# Patient Record
Sex: Male | Born: 1985 | Race: Black or African American | Hispanic: No | Marital: Single | Smoking: Current every day smoker
Health system: Southern US, Community
[De-identification: ages and names within clinical notes are randomized; demographics above are authoritative.]

---

## 2013-12-25 ENCOUNTER — Emergency Department (HOSPITAL_COMMUNITY)
Admission: EM | Admit: 2013-12-25 | Discharge: 2013-12-25 | Disposition: A | Discharge status: Home / Self Care | Payer: Self-pay | Attending: Emergency Medicine | Admitting: Emergency Medicine

## 2013-12-25 ENCOUNTER — Emergency Department (HOSPITAL_COMMUNITY): Payer: Self-pay

## 2013-12-25 ENCOUNTER — Encounter (HOSPITAL_COMMUNITY): Payer: Self-pay | Admitting: Emergency Medicine

## 2013-12-25 DIAGNOSIS — S99919A Unspecified injury of unspecified ankle, initial encounter: Principal | ICD-10-CM

## 2013-12-25 DIAGNOSIS — S8990XA Unspecified injury of unspecified lower leg, initial encounter: Secondary | ICD-10-CM | POA: Insufficient documentation

## 2013-12-25 DIAGNOSIS — Y929 Unspecified place or not applicable: Secondary | ICD-10-CM | POA: Insufficient documentation

## 2013-12-25 DIAGNOSIS — X500XXA Overexertion from strenuous movement or load, initial encounter: Secondary | ICD-10-CM | POA: Insufficient documentation

## 2013-12-25 DIAGNOSIS — F17200 Nicotine dependence, unspecified, uncomplicated: Secondary | ICD-10-CM | POA: Insufficient documentation

## 2013-12-25 DIAGNOSIS — Y9389 Activity, other specified: Secondary | ICD-10-CM | POA: Insufficient documentation

## 2013-12-25 DIAGNOSIS — S99929A Unspecified injury of unspecified foot, initial encounter: Principal | ICD-10-CM

## 2013-12-25 DIAGNOSIS — M25561 Pain in right knee: Secondary | ICD-10-CM

## 2013-12-25 MED ORDER — TRAMADOL HCL 50 MG PO TABS: 50.0000 mg | ORAL_TABLET | Freq: Four times a day (QID) | ORAL | Status: DC | PRN

## 2013-12-25 MED ORDER — NAPROXEN 500 MG PO TABS: 500.0000 mg | ORAL_TABLET | Freq: Two times a day (BID) | ORAL | Status: DC

## 2013-12-25 NOTE — ED Provider Notes (Signed)
CSN: [id number]     Arrival date & time 12/25/13  1013 History  This chart was scribed for non-physician practitioner Pauline Ausammy Andersson Larrabee, PA-C working with Shon Batonourtney F Horton, MD by Leone PayorSonum Patel, ED Scribe. This patient was seen in room APFT24/APFT24 and the patient's care was started at 11:20 AM.    Chief Complaint  Patient presents with  . Knee Pain    right    The history is provided by the patient. No language interpreter was used.    HPI Comments: Mark Ochoa is a 28 y.o. male who presents to the Emergency Department complaining of 1 day of sudden onset, constant, gradually worsening right knee pain with associated swelling. Patient states he was bending the right knee to visualize the bottom of his right foot when he heard and felt a pop. He states the pain is worse with movement and walking. He has [facility] powders with minimal relief. He denies recent fall or trauma to the affected area, redness, excessive warmth, numbness or weakness of the leg. He denies prior surgery to the right knee. He denies any other pain at this time.   History reviewed. No pertinent past medical history. History reviewed. No pertinent past surgical history. No family history on file. History  Substance Use Topics  . Smoking status: Current Every Day Smoker -- 0.50 packs/day    Types: Cigarettes  . Smokeless tobacco: Not on file  . Alcohol Use: Yes    Review of Systems  Constitutional: Negative for fever and chills.  Genitourinary: Negative for dysuria and difficulty urinating.  Musculoskeletal: Positive for arthralgias and joint swelling.  Skin: Negative for color change and wound.  Neurological: Negative for weakness and numbness.  All other systems reviewed and are negative.     Allergies  Review of patient's allergies indicates no known allergies.  Home Medications   Prior to Admission medications   Not on File   BP 121/74  Pulse 51  Temp(Src) 98.4 F (36.9 C) (Oral)  Resp 16  Ht 6'  7" (2.007 m)  Wt 200 lb (90.719 kg)  BMI 22.52 kg/m2  SpO2 100% Physical Exam  Nursing note and vitals reviewed. Constitutional: He is oriented to person, place, and time. He appears well-developed and well-nourished.  HENT:  Head: Normocephalic and atraumatic.  Cardiovascular: Normal rate, regular rhythm, normal heart sounds and intact distal pulses.   No murmur heard. Pulmonary/Chest: Effort normal and breath sounds normal. No respiratory distress. He has no wheezes. He has no rales.  Abdominal: He exhibits no distension.  Musculoskeletal: Normal range of motion. He exhibits tenderness. He exhibits no edema.  Localized tenderness to palpation to the lateral right knee. No edema, erythema, or effusion. Distal sensation intact, DP pulse brisk.  Pt has full ROM of the knee.  Compartments of the right leg are soft  Neurological: He is alert and oriented to person, place, and time. He exhibits normal muscle tone. Coordination normal.  Skin: Skin is warm and dry.  Psychiatric: He has a normal mood and affect.    ED Course  Procedures (including critical care time)  DIAGNOSTIC STUDIES: Oxygen Saturation is 100% on RA, normal by my interpretation.    COORDINATION OF CARE: 11:24 AM Will XRAY the right knee. Discussed treatment plan with pt at bedside and pt agreed to plan.   Labs Review Labs Reviewed - No data to display  Imaging Review Dg Knee Complete 4 Views Right  12/25/2013   CLINICAL DATA:  Pain  EXAM: RIGHT KNEE - COMPLETE 4+ VIEW  COMPARISON:  None.  FINDINGS: Frontal, lateral, and bilateral oblique views were obtained. There is no fracture, dislocation, or effusion. Joint spaces appear intact. No erosive change.  IMPRESSION: No abnormality noted.   Electronically Signed   By: Bretta Bang M.D.   On: 12/25/2013 12:24     EKG Interpretation None      MDM   Final diagnoses:  Right knee pain    Pt is ambulatory.  No concerning sx's for septic joint.  No edema or  effusion of the joint.  Likely sprain.  ACE wrap applied for support, he agrees to elevate, ice and orthopedic f/u.  Precautions given for excessive use of the ace wrap.    I personally performed the services described in this documentation, which was scribed in my presence. The recorded information has been reviewed and is accurate.   Diala Waxman L. Trisha Mangle, PA-C 12/25/13 1659

## 2013-12-25 NOTE — ED Notes (Signed)
Pt states he bent his right leg to look at the bottom of his foot and felt a pop in his knee and has been having worsening pain since.

## 2013-12-25 NOTE — ED Notes (Signed)
PA at bedside.

## 2013-12-25 NOTE — ED Provider Notes (Signed)
Medical screening examination/treatment/procedure(s) were performed by non-physician practitioner and as supervising physician I was immediately available for consultation/collaboration.   EKG Interpretation None        Courtney F Horton, MD 12/25/13 1847 

## 2013-12-25 NOTE — Discharge Instructions (Signed)

## 2013-12-25 NOTE — Care Management Note (Signed)
ED/CM noted patient did not have health insurance and/or PCP listed in the computer.  Patient was given the [place] resource handout with information on the clinics, food pantries, and the handout for new health insurance sign-up.  Patient expressed appreciation for information received. 

## 2018-06-22 ENCOUNTER — Emergency Department
Admission: EM | Admit: 2018-06-22 | Discharge: 2018-06-22 | Disposition: A | Discharge status: Home / Self Care | Payer: Self-pay | Attending: Student in an Organized Health Care Education/Training Program | Admitting: Student in an Organized Health Care Education/Training Program

## 2018-06-22 ENCOUNTER — Other Ambulatory Visit: Payer: Self-pay

## 2018-06-22 ENCOUNTER — Encounter: Payer: Self-pay | Admitting: Emergency Medicine

## 2018-06-22 DIAGNOSIS — K529 Noninfective gastroenteritis and colitis, unspecified: Secondary | ICD-10-CM | POA: Insufficient documentation

## 2018-06-22 DIAGNOSIS — F17210 Nicotine dependence, cigarettes, uncomplicated: Secondary | ICD-10-CM | POA: Insufficient documentation

## 2018-06-22 LAB — CBC
HCT: 42.4 % (ref 39.0–52.0)
HEMOGLOBIN: 14 g/dL (ref 13.0–17.0)
MCH: 29.1 pg (ref 26.0–34.0)
MCHC: 33 g/dL (ref 30.0–36.0)
MCV: 88.1 fL (ref 80.0–100.0)
Platelets: 294 10*3/uL (ref 150–400)
RBC: 4.81 MIL/uL (ref 4.22–5.81)
RDW: 13.1 % (ref 11.5–15.5)
WBC: 6.3 10*3/uL (ref 4.0–10.5)
nRBC: 0 % (ref 0.0–0.2)

## 2018-06-22 LAB — COMPREHENSIVE METABOLIC PANEL
ALT: 18 U/L (ref 0–44)
AST: 29 U/L (ref 15–41)
Albumin: 4.1 g/dL (ref 3.5–5.0)
Alkaline Phosphatase: 74 U/L (ref 38–126)
Anion gap: 6 (ref 5–15)
BILIRUBIN TOTAL: 1.9 mg/dL — AB (ref 0.3–1.2)
BUN: 10 mg/dL (ref 6–20)
CHLORIDE: 107 mmol/L (ref 98–111)
CO2: 25 mmol/L (ref 22–32)
Calcium: 9.3 mg/dL (ref 8.9–10.3)
Creatinine, Ser: 0.84 mg/dL (ref 0.61–1.24)
GFR calc Af Amer: >60 mL/min (ref >60)
GFR calc non Af Amer: >60 mL/min (ref >60)
Glucose, Bld: 98 mg/dL (ref 70–99)
POTASSIUM: 4.1 mmol/L (ref 3.5–5.1)
Sodium: 138 mmol/L (ref 135–145)
Total Protein: 7.1 g/dL (ref 6.5–8.1)

## 2018-06-22 LAB — LIPASE, BLOOD: Lipase: 31 U/L (ref 11–51)

## 2018-06-22 MED ORDER — OSELTAMIVIR PHOSPHATE 75 MG PO CAPS: 75.0000 mg | ORAL_CAPSULE | Freq: Two times a day (BID) | ORAL | 0 refills | Status: DC

## 2018-06-22 MED ORDER — ONDANSETRON 4 MG PO TBDP
4.0000 mg | ORAL_TABLET | Freq: Once | ORAL | Status: AC | PRN
  Administered 2018-06-22: 4 mg via ORAL
  Filled 2018-06-22: qty 1

## 2018-06-22 MED ORDER — ONDANSETRON 4 MG PO TBDP: 4.0000 mg | ORAL_TABLET | Freq: Three times a day (TID) | ORAL | 0 refills | Status: DC | PRN

## 2018-06-22 NOTE — ED Notes (Signed)
See triage note  Presents with some diarrhea and nausea  States sx's started yesterday  No fever

## 2018-06-22 NOTE — ED Triage Notes (Signed)
Pt arrived with complaints of n/v/d that started yesterday. Pt reports 1 episode of emesis and 3 episodes of diarrhea. Pt also reports abdominal cramping.

## 2018-06-22 NOTE — ED Provider Notes (Signed)
[facility] Emergency Department Provider Note  ____________________________________________   First MD Initiated Contact with Patient 06/22/18 1455     (approximate)  I have reviewed the triage vital signs and the nursing notes.   HISTORY  Chief Complaint Diarrhea and Emesis    HPI Mark Ochoa is a 33 y.o. male presents emergency department with diarrhea and nausea with some vomiting.  Symptoms started yesterday.  No known fever.  States he did feel hot and cold.  No abdominal pain.  He had one episode of vomiting and 3 episodes of diarrhea.  He states his son has had the same symptoms.  His daughter had symptoms like this last week.    History reviewed. No pertinent past medical history.  There are no active problems to display for this patient.   History reviewed. No pertinent surgical history.  Prior to Admission medications   Medication Sig Start Date End Date Taking? Authorizing Provider  ondansetron (ZOFRAN-ODT) 4 MG disintegrating tablet Take 1 tablet (4 mg total) by mouth every 8 (eight) hours as needed. 06/22/18   Aran Menning, Roselyn Bering, PA-C  oseltamivir (TAMIFLU) 75 MG capsule Take 1 capsule (75 mg total) by mouth 2 (two) times daily. 06/22/18   Faythe Ghee, PA-C    Allergies Patient has no known allergies.  No family history on file.  Social History Social History   Tobacco Use  . Smoking status: Current Every Day Smoker    Packs/day: 0.50    Types: Cigarettes  Substance Use Topics  . Alcohol use: Yes  . Drug use: Not on file    Review of Systems  Constitutional: No fever/chills Eyes: No visual changes. ENT: No sore throat. Respiratory: Denies cough Gastrointestinal: Positive for nausea/vomiting/diarrhea Genitourinary: Negative for dysuria. Musculoskeletal: Negative for back pain. Skin: Negative for rash.    ____________________________________________   PHYSICAL EXAM:  VITAL SIGNS: ED Triage Vitals  Enc  Vitals Group     BP 06/22/18 1314 117/73     Pulse Rate 06/22/18 1314 66     Resp 06/22/18 1314 18     Temp 06/22/18 1314 98.6 F (37 C)     Temp Source 06/22/18 1314 Oral     SpO2 06/22/18 1314 100 %     Weight 06/22/18 1315 200 lb (90.7 kg)     Height 06/22/18 1315 6\' 8"  (2.032 m)     Head Circumference --      Peak Flow --      Pain Score 06/22/18 1315 6     Pain Loc --      Pain Edu? --      Excl. in GC? --     Constitutional: Alert and oriented. Well appearing and in no acute distress. Eyes: Conjunctivae are normal.  Head: Atraumatic. Nose: No congestion/rhinnorhea. Mouth/Throat: Mucous membranes are moist.  Throat appears normal Neck:  supple no lymphadenopathy noted Cardiovascular: Normal rate, regular rhythm. Heart sounds are normal Respiratory: Normal respiratory effort.  No retractions, lungs c t a  Abd: soft nontender bs normal all 4 quad GU: deferred Musculoskeletal: FROM all extremities, warm and well perfused Neurologic:  Normal speech and language.  Skin:  Skin is warm, dry and intact. No rash noted. Psychiatric: Mood and affect are normal. Speech and behavior are normal.  ____________________________________________   LABS (all labs ordered are listed, but only abnormal results are displayed)  Labs Reviewed  COMPREHENSIVE METABOLIC PANEL - Abnormal; Notable for the following components:  Result Value   Total Bilirubin 1.9 (*)    All other components within normal limits  LIPASE, BLOOD  CBC  URINALYSIS, COMPLETE (UACMP) WITH MICROSCOPIC   ____________________________________________   ____________________________________________  RADIOLOGY    ____________________________________________   PROCEDURES  Procedure(s) performed: Zofran 4 mg ODT  Procedures    ____________________________________________   INITIAL IMPRESSION / ASSESSMENT AND PLAN / ED COURSE  Pertinent labs & imaging results that were available during my care of the  patient were reviewed by me and considered in my medical decision making (see chart for details).   Patient is 33 year old male presents emergency department complaining of nausea/vomiting/diarrhea.  Symptoms started yesterday.  Other family members have the same.  Physical exam shows that patient appears well.  Abdomen is nontender.  Remainder exam is unremarkable  CBC, complete metabolic panel, and lipase are all normal   Patient states that he is not had any more vomiting since the Zofran.  I explained all the test results to him.  Told him this is a viral illness.  His son's test is positive for influenza B so he was placed on Tamiflu also.  Is given a prescription for Zofran.  He was given a work note.  He is not to return to work until he has been fever free and free of diarrhea for 24 hours.  He states he understands will comply with our instructions.  He was discharged in stable condition.  As part of my medical decision making, I reviewed the following data within the electronic MEDICAL RECORD NUMBER Nursing notes reviewed and incorporated, Labs reviewed CBC, metabolic panel, and lipase are normal, Old chart reviewed, Notes from prior ED visits and  Controlled Substance Database  ____________________________________________   FINAL CLINICAL IMPRESSION(S) / ED DIAGNOSES  Final diagnoses:  Gastroenteritis      NEW MEDICATIONS STARTED DURING THIS VISIT:  New Prescriptions   ONDANSETRON (ZOFRAN-ODT) 4 MG DISINTEGRATING TABLET    Take 1 tablet (4 mg total) by mouth every 8 (eight) hours as needed.   OSELTAMIVIR (TAMIFLU) 75 MG CAPSULE    Take 1 capsule (75 mg total) by mouth 2 (two) times daily.     Note:  This document was prepared using Dragon voice recognition software and may include unintentional dictation errors.    Faythe Ghee, PA-C 06/22/18 1623    Willy Eddy, MD 06/22/18 [phone]

## 2018-06-22 NOTE — Discharge Instructions (Addendum)
Follow-up with your regular doctor if not better in 3 days.  Return emergency department worsening.  Drink plenty of fluids.  Tylenol and ibuprofen for fever.  Zofran for nausea/vomiting.  Over-the-counter Imodium for diarrhea.

## 2018-09-06 ENCOUNTER — Other Ambulatory Visit: Payer: Self-pay

## 2018-09-06 ENCOUNTER — Encounter: Payer: Self-pay | Admitting: Emergency Medicine

## 2018-09-06 ENCOUNTER — Emergency Department
Admission: EM | Admit: 2018-09-06 | Discharge: 2018-09-06 | Disposition: A | Discharge status: Home / Self Care | Payer: Self-pay | Attending: Emergency Medicine | Admitting: Emergency Medicine

## 2018-09-06 DIAGNOSIS — J020 Streptococcal pharyngitis: Secondary | ICD-10-CM | POA: Insufficient documentation

## 2018-09-06 DIAGNOSIS — F17210 Nicotine dependence, cigarettes, uncomplicated: Secondary | ICD-10-CM | POA: Insufficient documentation

## 2018-09-06 LAB — GROUP A STREP BY PCR: Group A Strep by PCR: DETECTED — AB

## 2018-09-06 MED ORDER — LIDOCAINE VISCOUS HCL 2 % MT SOLN: 10.0000 mL | OROMUCOSAL | 0 refills | Status: DC | PRN

## 2018-09-06 MED ORDER — AMOXICILLIN 500 MG PO CAPS
500.0000 mg | ORAL_CAPSULE | Freq: Once | ORAL | Status: AC
  Administered 2018-09-06: 500 mg via ORAL
  Filled 2018-09-06: qty 1

## 2018-09-06 MED ORDER — LIDOCAINE VISCOUS HCL 2 % MT SOLN
15.0000 mL | Freq: Once | OROMUCOSAL | Status: AC
  Administered 2018-09-06: 15 mL via OROMUCOSAL
  Filled 2018-09-06: qty 15

## 2018-09-06 MED ORDER — AMOXICILLIN 500 MG PO CAPS: 500.0000 mg | ORAL_CAPSULE | Freq: Three times a day (TID) | ORAL | 0 refills | Status: DC

## 2018-09-06 NOTE — ED Triage Notes (Signed)
Pt here with c/o sore throat, states he has a hx of strep and this feels the same. Denies fever, states no white spots on back of throat yesterday, not sure today. NAD, denies cough.

## 2018-09-06 NOTE — ED Provider Notes (Signed)
[facility] Emergency Department Provider Note  ____________________________________________  Time seen: Approximately 2:13 PM  I have reviewed the triage vital signs and the nursing notes.   HISTORY  Chief Complaint Sore Throat    HPI Mark Ochoa is a 33 y.o. male that presents to the emergency department for evaluation of sore throat.  Patient has had strep throat in the past and states that this feels the same.  No sick contacts.  No fever.   History reviewed. No pertinent past medical history.  There are no active problems to display for this patient.   History reviewed. No pertinent surgical history.  Prior to Admission medications   Medication Sig Start Date End Date Taking? Authorizing Provider  amoxicillin (AMOXIL) 500 MG capsule Take 1 capsule (500 mg total) by mouth 3 (three) times daily. 09/06/18   Enid Derry, PA-C  lidocaine (XYLOCAINE) 2 % solution Use as directed 10 mLs in the mouth or throat as needed. 09/06/18   Enid Derry, PA-C    Allergies Patient has no known allergies.  No family history on file.  Social History Social History   Tobacco Use  . Smoking status: Current Every Day Smoker    Packs/day: 0.50    Types: Cigarettes  . Smokeless tobacco: Never Used  Substance Use Topics  . Alcohol use: Yes  . Drug use: Not on file     Review of Systems  Constitutional: No fever/chills Eyes: No visual changes. No discharge. ENT: Negative for congestion and rhinorrhea.  Positive for sore throat. Cardiovascular: No chest pain. Respiratory: Negative for cough. No SOB. Gastrointestinal: No abdominal pain.  No nausea, no vomiting.  Musculoskeletal: Negative for musculoskeletal pain. Skin: Negative for rash, abrasions, lacerations, ecchymosis. Neurological: Negative for headaches.   ____________________________________________   PHYSICAL EXAM:  VITAL SIGNS: ED Triage Vitals  Enc Vitals Group     BP 09/06/18 1219  118/69     Pulse Rate 09/06/18 1219 64     Resp 09/06/18 1219 18     Temp 09/06/18 1219 98.9 F (37.2 C)     Temp Source 09/06/18 1219 Oral     SpO2 09/06/18 1219 100 %     Weight 09/06/18 1219 200 lb (90.7 kg)     Height 09/06/18 1219 6\' 8"  (2.032 m)     Head Circumference --      Peak Flow --      Pain Score 09/06/18 1224 10     Pain Loc --      Pain Edu? --      Excl. in GC? --      Constitutional: Alert and oriented. Well appearing and in no acute distress. Eyes: Conjunctivae are normal. PERRL. EOMI. No discharge. Head: Atraumatic. ENT: No frontal and maxillary sinus tenderness.      Ears: Tympanic membranes pearly gray with good landmarks. No discharge.      Nose: No congestion/rhinnorhea.      Mouth/Throat: Mucous membranes are moist. Oropharynx erythematous. Tonsils not enlarged.  Minimal exudates bilaterally. Uvula midline. Neck: No stridor.   Hematological/Lymphatic/Immunilogical: No cervical lymphadenopathy. Cardiovascular: Normal rate, regular rhythm.  Good peripheral circulation. Respiratory: Normal respiratory effort without tachypnea or retractions. Lungs CTAB. Good air entry to the bases with no decreased or absent breath sounds. Gastrointestinal: Bowel sounds 4 quadrants. Soft and nontender to palpation. No guarding or rigidity. No palpable masses. No distention. Musculoskeletal: Full range of motion to all extremities. No gross deformities appreciated. Neurologic:  Normal speech and language.  No gross focal neurologic deficits are appreciated.  Skin:  Skin is warm, dry and intact. No rash noted. Psychiatric: Mood and affect are normal. Speech and behavior are normal. Patient exhibits appropriate insight and judgement.   ____________________________________________   LABS (all labs ordered are listed, but only abnormal results are displayed)  Labs Reviewed  GROUP A STREP BY PCR - Abnormal; Notable for the following components:      Result Value   Group A  Strep by PCR DETECTED (*)    All other components within normal limits   ____________________________________________  EKG   ____________________________________________  RADIOLOGY   No results found.  ____________________________________________    PROCEDURES  Procedure(s) performed:    Procedures    Medications  amoxicillin (AMOXIL) capsule 500 mg (500 mg Oral Given 09/06/18 1425)  lidocaine (XYLOCAINE) 2 % viscous mouth solution 15 mL (15 mLs Mouth/Throat Given 09/06/18 1426)     ____________________________________________   INITIAL IMPRESSION / ASSESSMENT AND PLAN / ED COURSE  Pertinent labs & imaging results that were available during my care of the patient were reviewed by me and considered in my medical decision making (see chart for details).  Review of the [place] CSRS was performed in accordance of the [facility] prior to dispensing any controlled drugs.   Patient's diagnosis is consistent with strep pharyngitis. Vital signs and exam are reassuring.  Strep test is positive.  Patient appears well and is staying well hydrated. Patient should alternate tylenol and ibuprofen for fever. Patient feels comfortable going home. Patient will be discharged home with prescriptions for amoxicillin and viscous lidocaine. Patient is to follow up with primary care as needed or otherwise directed. Patient is given ED precautions to return to the ED for any worsening or new symptoms.     ____________________________________________  FINAL CLINICAL IMPRESSION(S) / ED DIAGNOSES  Final diagnoses:  Strep throat      NEW MEDICATIONS STARTED DURING THIS VISIT:  ED Discharge Orders         Ordered    amoxicillin (AMOXIL) 500 MG capsule  3 times daily     09/06/18 1434    lidocaine (XYLOCAINE) 2 % solution  As needed     09/06/18 1434              This chart was dictated using voice recognition software/Dragon. Despite best efforts to proofread, errors can occur which  can change the meaning. Any change was purely unintentional.    Enid DerryWagner, Lilliane Sposito, PA-C 09/06/18 1554    Emily FilbertWilliams, Jonathan E, MD 09/06/18 [phone]

## 2019-06-28 ENCOUNTER — Other Ambulatory Visit: Payer: Self-pay

## 2019-06-28 ENCOUNTER — Encounter: Payer: Self-pay | Admitting: Emergency Medicine

## 2019-06-28 ENCOUNTER — Emergency Department
Admission: EM | Admit: 2019-06-28 | Discharge: 2019-06-28 | Disposition: A | Discharge status: Home / Self Care | Payer: Self-pay | Attending: Emergency Medicine | Admitting: Emergency Medicine

## 2019-06-28 DIAGNOSIS — X500XXA Overexertion from strenuous movement or load, initial encounter: Secondary | ICD-10-CM | POA: Insufficient documentation

## 2019-06-28 DIAGNOSIS — S29019A Strain of muscle and tendon of unspecified wall of thorax, initial encounter: Secondary | ICD-10-CM | POA: Insufficient documentation

## 2019-06-28 DIAGNOSIS — Y929 Unspecified place or not applicable: Secondary | ICD-10-CM | POA: Insufficient documentation

## 2019-06-28 DIAGNOSIS — Y999 Unspecified external cause status: Secondary | ICD-10-CM | POA: Insufficient documentation

## 2019-06-28 DIAGNOSIS — F17210 Nicotine dependence, cigarettes, uncomplicated: Secondary | ICD-10-CM | POA: Insufficient documentation

## 2019-06-28 DIAGNOSIS — Y9389 Activity, other specified: Secondary | ICD-10-CM | POA: Insufficient documentation

## 2019-06-28 MED ORDER — BACLOFEN 10 MG PO TABS: 10.0000 mg | ORAL_TABLET | Freq: Every day | ORAL | 1 refills | Status: DC

## 2019-06-28 MED ORDER — LIDOCAINE 5 % EX PTCH
1.0000 | MEDICATED_PATCH | CUTANEOUS | Status: DC
  Administered 2019-06-28: 1 via TRANSDERMAL
  Filled 2019-06-28: qty 1

## 2019-06-28 MED ORDER — LIDOCAINE 5 % EX PTCH: 1.0000 | MEDICATED_PATCH | Freq: Two times a day (BID) | CUTANEOUS | 0 refills | Status: DC

## 2019-06-28 MED ORDER — MELOXICAM 15 MG PO TABS: 15.0000 mg | ORAL_TABLET | Freq: Every day | ORAL | 2 refills | Status: DC

## 2019-06-28 NOTE — Discharge Instructions (Signed)
Follow-up with your regular doctor or Dr. Rosita Kea if not improving in 5 to 7 days Take your medication as prescribed Apply wet heat to the muscle spasm, ice to your spine Apply the Lidoderm patch to the area that is spasmed Return to the emergency department if you are worsening You may also try chiropractor.  I would recommend Dr. Patrici Ranks

## 2019-06-28 NOTE — ED Triage Notes (Signed)
Pt reports moved some things a few days ago and doesn't remember any obvious injuries but when he woke the next am his lower back was hurting.

## 2019-06-28 NOTE — ED Provider Notes (Signed)
[facility] Emergency Department Provider Note  ____________________________________________   First MD Initiated Contact with Patient 06/28/19 1300     (approximate)  I have reviewed the triage vital signs and the nursing notes.   HISTORY  Chief Complaint Back Pain    HPI Mark Ochoa is a 34 y.o. male  C/o low back pain for 1 day, positive known injury, patient states he was helping his family move items, pain is worse with movement, increased with bending over, denies numbness, tingling, or changes in bowel/urinary habits,  Using otc meds without relief Remainder ros neg   History reviewed. No pertinent past medical history.  There are no problems to display for this patient.   History reviewed. No pertinent surgical history.  Prior to Admission medications   Medication Sig Start Date End Date Taking? Authorizing Provider  baclofen (LIORESAL) 10 MG tablet Take 1 tablet (10 mg total) by mouth daily. 06/28/19 06/27/20  Nonnie Pickney, Roselyn Bering, PA-C  lidocaine (LIDODERM) 5 % Place 1 patch onto the skin every 12 (twelve) hours. Remove & Discard patch within 12 hours or as directed by MD 06/28/19 06/27/20  Sherrie Mustache, Roselyn Bering, PA-C  meloxicam (MOBIC) 15 MG tablet Take 1 tablet (15 mg total) by mouth daily. 06/28/19 06/27/20  Faythe Ghee, PA-C    Allergies Patient has no known allergies.  No family history on file.  Social History Social History   Tobacco Use  . Smoking status: Current Every Day Smoker    Packs/day: 0.50    Types: Cigarettes  . Smokeless tobacco: Never Used  Substance Use Topics  . Alcohol use: Yes  . Drug use: Not on file    Review of Systems  Constitutional: No fever/chills Eyes: No visual changes. ENT: No sore throat. Respiratory: Denies cough Genitourinary: Negative for dysuria. Musculoskeletal: Positive for back pain. Skin: Negative for rash.    ____________________________________________   PHYSICAL EXAM:  VITAL  SIGNS: ED Triage Vitals  Enc Vitals Group     BP 06/28/19 1251 130/90     Pulse Rate 06/28/19 1251 99     Resp 06/28/19 1251 16     Temp 06/28/19 1251 98.8 F (37.1 C)     Temp Source 06/28/19 1251 Oral     SpO2 06/28/19 1251 100 %     Weight 06/28/19 1238 200 lb (90.7 kg)     Height 06/28/19 1238 6\' 8"  (2.032 m)     Head Circumference --      Peak Flow --      Pain Score 06/28/19 1238 9     Pain Loc --      Pain Edu? --      Excl. in GC? --     Constitutional: Alert and oriented. Well appearing and in no acute distress. Eyes: Conjunctivae are normal.  Head: Atraumatic.   Neck:  supple no lymphadenopathy noted Cardiovascular: Normal rate, regular rhythm. Heart sounds are normal Respiratory: Normal respiratory effort.  No retractions, lungs c t a  Abd: soft nontender bs normal all 4 quad GU: deferred Musculoskeletal: FROM all extremities, warm and well perfused.  Decreased rom of back due to discomfort, lumbar spine nontender, negative slr, 5/5 strength in great toes b/l, 5/5 strength in lower legs, n/v intact, large spasm noted in the mid thoracic back Neurologic:  Normal speech and language.  Skin:  Skin is warm, dry and intact. No rash noted. Psychiatric: Mood and affect are normal. Speech and behavior are normal.  ____________________________________________   LABS (all labs ordered are listed, but only abnormal results are displayed)  Labs Reviewed - No data to display ____________________________________________   ____________________________________________  RADIOLOGY    ____________________________________________   PROCEDURES  Procedure(s) performed: Lidoderm patch   Procedures    ____________________________________________   INITIAL IMPRESSION / ASSESSMENT AND PLAN / ED COURSE  Pertinent labs & imaging results that were available during my care of the patient were reviewed by me and considered in my medical decision making (see chart for  details).   Patient is a 34 year old male presents emergency department complaint of back pain after lifting items.  See HPI  Physical exam shows patient to appear well.  Large spasm noted in the mid back. Remainder of exam is unremarkable  Explained the findings to the patient.  He was given a Lidoderm patch while here in the ED.  Prescription for meloxicam, baclofen, and Mobic.  He was given a work note.  He is to follow-up with either orthopedics or chiropractor.  He states he understands will comply.  Is discharged in stable condition.     As part of my medical decision making, I reviewed the following data within the [place] notes reviewed and incorporated, Old chart reviewed, Notes from prior ED visits and [place] Controlled Substance Database  ____________________________________________   FINAL CLINICAL IMPRESSION(S) / ED DIAGNOSES  Final diagnoses:  Thoracic myofascial strain, initial encounter      NEW MEDICATIONS STARTED DURING THIS VISIT:  Discharge Medication List as of 06/28/2019  1:36 PM    START taking these medications   Details  baclofen (LIORESAL) 10 MG tablet Take 1 tablet (10 mg total) by mouth daily., Starting Wed 06/28/2019, Until Thu 06/27/2020, Normal    lidocaine (LIDODERM) 5 % Place 1 patch onto the skin every 12 (twelve) hours. Remove & Discard patch within 12 hours or as directed by MD, Starting Wed 06/28/2019, Until Thu 06/27/2020, Normal    meloxicam (MOBIC) 15 MG tablet Take 1 tablet (15 mg total) by mouth daily., Starting Wed 06/28/2019, Until Thu 06/27/2020, Normal         Note:  This document was prepared using Dragon voice recognition software and may include unintentional dictation errors.     Versie Starks, PA-C 06/28/19 Glorianne Manchester, MD 06/29/19 [phone]

## 2020-03-04 ENCOUNTER — Emergency Department
Admission: EM | Admit: 2020-03-04 | Discharge: 2020-03-04 | Disposition: A | Discharge status: Home / Self Care | Payer: Self-pay | Attending: Emergency Medicine | Admitting: Emergency Medicine

## 2020-03-04 ENCOUNTER — Other Ambulatory Visit: Payer: Self-pay

## 2020-03-04 ENCOUNTER — Encounter: Payer: Self-pay | Admitting: Emergency Medicine

## 2020-03-04 DIAGNOSIS — F17210 Nicotine dependence, cigarettes, uncomplicated: Secondary | ICD-10-CM | POA: Insufficient documentation

## 2020-03-04 DIAGNOSIS — J020 Streptococcal pharyngitis: Secondary | ICD-10-CM | POA: Insufficient documentation

## 2020-03-04 LAB — GROUP A STREP BY PCR: Group A Strep by PCR: DETECTED — AB

## 2020-03-04 MED ORDER — AMOXICILLIN 500 MG PO CAPS
500.0000 mg | ORAL_CAPSULE | Freq: Once | ORAL | Status: AC
  Administered 2020-03-04: 500 mg via ORAL
  Filled 2020-03-04: qty 1

## 2020-03-04 MED ORDER — AMOXICILLIN 500 MG PO CAPS: 500.0000 mg | ORAL_CAPSULE | Freq: Three times a day (TID) | ORAL | 0 refills | Status: AC

## 2020-03-04 MED ORDER — DEXAMETHASONE SODIUM PHOSPHATE 10 MG/ML IJ SOLN
10.0000 mg | Freq: Once | INTRAMUSCULAR | Status: AC
  Administered 2020-03-04: 10 mg via INTRAMUSCULAR
  Filled 2020-03-04: qty 1

## 2020-03-04 MED ORDER — PREDNISONE 10 MG (21) PO TBPK: ORAL_TABLET | ORAL | 0 refills | Status: AC

## 2020-03-04 NOTE — Discharge Instructions (Signed)
Follow-up with your regular doctor if not improving in 2 to 3 days.  Return emergency department worsening.  Take medication as prescribed.  You may want to follow-up with an ENT doctor.  You can discuss having her tonsils removed.  Phone number is attached for [facility].

## 2020-03-04 NOTE — ED Provider Notes (Signed)
[facility] Emergency Department Provider Note  ____________________________________________   First MD Initiated Contact with Patient 03/04/20 1429     (approximate)  I have reviewed the triage vital signs and the nursing notes.   HISTORY  Chief Complaint Sore Throat    HPI Mark Ochoa is a 34 y.o. male presents emergency department complaining of a sore throat for 3 days.  Unsure if he has had a fever but states he felt warm.  History of strep throat multiple times.  He states he is having difficulty swallowing.  He denies any chest pain or shortness of breath.    History reviewed. No pertinent past medical history.  There are no problems to display for this patient.   History reviewed. No pertinent surgical history.  Prior to Admission medications   Medication Sig Start Date End Date Taking? Authorizing Provider  amoxicillin (AMOXIL) 500 MG capsule Take 1 capsule (500 mg total) by mouth 3 (three) times daily. 03/04/20   Dushaun Okey, Roselyn Bering, PA-C  predniSONE (STERAPRED UNI-PAK 21 TAB) 10 MG (21) TBPK tablet Take 6 pills on day one then decrease by 1 pill each day 03/04/20   Faythe Ghee, PA-C    Allergies Patient has no known allergies.  No family history on file.  Social History Social History   Tobacco Use  . Smoking status: Current Every Day Smoker    Packs/day: 0.50    Types: Cigarettes  . Smokeless tobacco: Never Used  Substance Use Topics  . Alcohol use: Yes  . Drug use: Not on file    Review of Systems  Constitutional: No fever/chills Eyes: No visual changes. ENT: Positive sore throat. Respiratory: Denies cough Cardiovascular: Denies chest pain Gastrointestinal: Denies abdominal pain Genitourinary: Negative for dysuria. Musculoskeletal: Negative for back pain. Skin: Negative for rash. Psychiatric: no mood changes,     ____________________________________________   PHYSICAL EXAM:  VITAL SIGNS: ED Triage  Vitals  Enc Vitals Group     BP 03/04/20 1212 122/72     Pulse Rate 03/04/20 1212 63     Resp 03/04/20 1212 20     Temp 03/04/20 1212 99.4 F (37.4 C)     Temp Source 03/04/20 1212 Oral     SpO2 03/04/20 1212 99 %     Weight 03/04/20 1213 204 lb (92.5 kg)     Height 03/04/20 1213 6\' 7"  (2.007 m)     Head Circumference --      Peak Flow --      Pain Score 03/04/20 1213 9     Pain Loc --      Pain Edu? --      Excl. in GC? --     Constitutional: Alert and oriented. Well appearing and in no acute distress. Eyes: Conjunctivae are normal.  Head: Atraumatic. Nose: No congestion/rhinnorhea. Mouth/Throat: Mucous membranes are moist.  Throat is red with minimal swelling noted on the tonsil Neck:  supple no lymphadenopathy noted Cardiovascular: Normal rate, regular rhythm. Heart sounds are normal Respiratory: Normal respiratory effort.  No retractions, lungs c t a  GU: deferred Musculoskeletal: FROM all extremities, warm and well perfused Neurologic:  Normal speech and language.  Skin:  Skin is warm, dry and intact. No rash noted. Psychiatric: Mood and affect are normal. Speech and behavior are normal.  ____________________________________________   LABS (all labs ordered are listed, but only abnormal results are displayed)  Labs Reviewed  GROUP A STREP BY PCR - Abnormal; Notable for the following  components:      Result Value   Group A Strep by PCR DETECTED (*)    All other components within normal limits   ____________________________________________   ____________________________________________  RADIOLOGY    ____________________________________________   PROCEDURES  Procedure(s) performed: No  Procedures    ____________________________________________   INITIAL IMPRESSION / ASSESSMENT AND PLAN / ED COURSE  Pertinent labs & imaging results that were available during my care of the patient were reviewed by me and considered in my medical decision making  (see chart for details).   Patient is a 34 year old male presents with sore throat. See HPI, physical exam shows patient appears stable.  Throat is red and swollen.  No difficulty swallowing liquids or speaking.  Strep test is positive  Explained the findings to the patient.  He was given Decadron 10 mg IM and amoxicillin 500 mg while here in the ED.  Prescription for Sterapred and amoxicillin.  Follow-up with ENT to discuss tonsillectomy.  Return emergency department for worsening.  He was discharged stable condition.     Mark Ochoa was evaluated in Emergency Department on 03/04/2020 for the symptoms described in the history of present illness. He was evaluated in the context of the global COVID-19 pandemic, which necessitated consideration that the patient might be at risk for infection with the SARS-CoV-2 virus that causes COVID-19. Institutional protocols and algorithms that pertain to the evaluation of patients at risk for COVID-19 are in a state of rapid change based on information released by regulatory bodies including the CDC and federal and state organizations. These policies and algorithms were followed during the patient's care in the ED.    As part of my medical decision making, I reviewed the following data within the electronic MEDICAL RECORD NUMBER Nursing notes reviewed and incorporated, Labs reviewed strep test positive, Old chart reviewed, Notes from prior ED visits and [place] Controlled Substance Database  ____________________________________________   FINAL CLINICAL IMPRESSION(S) / ED DIAGNOSES  Final diagnoses:  Acute streptococcal pharyngitis      NEW MEDICATIONS STARTED DURING THIS VISIT:  Discharge Medication List as of 03/04/2020  2:45 PM    START taking these medications   Details  amoxicillin (AMOXIL) 500 MG capsule Take 1 capsule (500 mg total) by mouth 3 (three) times daily., Starting Mon 03/04/2020, Normal    predniSONE (STERAPRED UNI-PAK 21 TAB) 10 MG (21)  TBPK tablet Take 6 pills on day one then decrease by 1 pill each day, Normal         Note:  This document was prepared using Dragon voice recognition software and may include unintentional dictation errors.    Faythe Ghee, PA-C 03/04/20 1551    Dionne Bucy, MD 03/04/20 [phone]

## 2020-03-04 NOTE — ED Notes (Signed)
Right side of neck swollen to touch. Throat red, sore.

## 2020-03-04 NOTE — ED Triage Notes (Addendum)
Pt in w/sore throat x 3 days, significant swelling of R tonsil. Frequent hx of strep, once w/hospitalization. Denies any sob, cough or n/v. Is having trouble swallowing. Temp 99.4 in triage, sats 99% on RA.

## 2021-10-15 ENCOUNTER — Emergency Department: Payer: Self-pay

## 2021-10-15 ENCOUNTER — Encounter: Payer: Self-pay | Admitting: Emergency Medicine

## 2021-10-15 ENCOUNTER — Emergency Department
Admission: EM | Admit: 2021-10-15 | Discharge: 2021-10-15 | Disposition: A | Discharge status: Home / Self Care | Payer: Self-pay | Attending: Emergency Medicine | Admitting: Emergency Medicine

## 2021-10-15 ENCOUNTER — Other Ambulatory Visit: Payer: Self-pay

## 2021-10-15 DIAGNOSIS — M5441 Lumbago with sciatica, right side: Secondary | ICD-10-CM | POA: Insufficient documentation

## 2021-10-15 MED ORDER — NAPROXEN 500 MG PO TABS: 500.0000 mg | ORAL_TABLET | Freq: Two times a day (BID) | ORAL | 0 refills | Status: AC

## 2021-10-15 MED ORDER — CYCLOBENZAPRINE HCL 10 MG PO TABS: 10.0000 mg | ORAL_TABLET | Freq: Three times a day (TID) | ORAL | 0 refills | Status: AC | PRN

## 2021-10-15 NOTE — ED Provider Notes (Signed)
[facility] Provider Note    None    (approximate)   History   Chief Complaint Back Pain   HPI Mark Ochoa is a 36 y.o. male, no remarkable medical history, presents to the emergency department for evaluation of back pain.  He states that he was bending over to pick something up yesterday when he felt a sudden onset of pain along his lower back and "butt bone".  He states that the pain is right along the vertebrae.  In addition endorses numbness/tingling, down his right lower extremity.  He is still able to ambulate, though with moderate pain.  Denies fever/chills, chest pain, shortness breath, abdominal pain, flank pain, nausea/vomiting, diarrhea, dysuria, headache, bowel/bladder dysfunction, saddle anesthesia, or dizziness.  History Limitations: No limitations        Physical Exam  Triage Vital Signs: ED Triage Vitals  Enc Vitals Group     BP 10/15/21 1114 (!) 143/104     Pulse Rate 10/15/21 1114 (!) 58     Resp 10/15/21 1114 16     Temp 10/15/21 1114 98.4 F (36.9 C)     Temp src --      SpO2 10/15/21 1114 98 %     Weight --      Height --      Head Circumference --      Peak Flow --      Pain Score 10/15/21 1113 10     Pain Loc --      Pain Edu? --      Excl. in GC? --     Most recent vital signs: Vitals:   10/15/21 1114  BP: (!) 143/104  Pulse: (!) 58  Resp: 16  Temp: 98.4 F (36.9 C)  SpO2: 98%    General: Awake, NAD.  Skin: Warm, dry. No rashes or lesions.  Eyes: PERRL. Conjunctivae normal.  CV: Good peripheral perfusion.  Resp: Normal effort.  Abd: Soft, non-tender. No distention.  Neuro: At baseline. No gross neurological deficits.   Focused Exam: No gross deformities or step-off along the spine.  He endorses tenderness when palpating the lumbar spine and sacrum/coccyx region.  No gross deformities to the lower extremities.  Pulse, motor, sensation intact distally.   Physical Exam    ED Results / Procedures /  Treatments  Labs (all labs ordered are listed, but only abnormal results are displayed) Labs Reviewed - No data to display   EKG N/A.   RADIOLOGY  ED Provider Interpretation: I personally viewed and interpreted these images.  No evidence of vertebral fracture or misalignment on the lumbar or sacrum/coccyx x-ray.  DG Lumbar Spine 2-3 Views  Result Date: 10/15/2021 CLINICAL DATA:  Back pain EXAM: LUMBAR SPINE - 2-3 VIEW COMPARISON:  None Available. FINDINGS: There is no evidence of lumbar spine fracture. Alignment is normal. Intervertebral disc spaces are maintained. Aortic Atherosclerosis (ICD10-170.0). IMPRESSION: Negative. Electronically Signed   By: Corlis Leak M.D.   On: 10/15/2021 11:55   DG Sacrum/Coccyx  Result Date: 10/15/2021 CLINICAL DATA:  Back pain EXAM: SACRUM AND COCCYX - 2+ VIEW COMPARISON:  None Available. FINDINGS: There is no evidence of fracture or other focal bone lesions. IMPRESSION: Negative. Electronically Signed   By: Corlis Leak M.D.   On: 10/15/2021 11:55    PROCEDURES:  Critical Care performed: N/A  Procedures    MEDICATIONS ORDERED IN ED: Medications - No data to display   IMPRESSION / MDM / ASSESSMENT AND PLAN / ED  COURSE  I reviewed the triage vital signs and the nursing notes.                              Differential diagnosis includes, but is not limited to, lumbosacral strain, vertebral fracture, lumbar radiculopathy  ED Course Patient appears well, NAD.  Assessment/Plan Presentation consistent with lumbosacral strain with associated lumbar radiculopathy.  X-ray reassuring for no evidence of acute fractures.  He is still able to ambulate on his own without assistance.  Will provide with prescription for cyclobenzaprine and naproxen.  We will additionally provide him with a referral to orthopedics if his pain fails to improve after 5 weeks.  We will plan to discharge  Patient's presentation is most consistent with acute complicated illness /  injury requiring diagnostic workup.   Provided the patient with anticipatory guidance, return precautions, and educational material. Encouraged the patient to return to the emergency department at any time if they begin to experience any new or worsening symptoms. Patient expressed understanding and agreed with the plan.       FINAL CLINICAL IMPRESSION(S) / ED DIAGNOSES   Final diagnoses:  Acute midline low back pain with right-sided sciatica     Rx / DC Orders   ED Discharge Orders          Ordered    cyclobenzaprine (FLEXERIL) 10 MG tablet  3 times daily PRN        10/15/21 1202    naproxen (NAPROSYN) 500 MG tablet  2 times daily with meals        10/15/21 1202             Note:  This document was prepared using Dragon voice recognition software and may include unintentional dictation errors.   Varney Daily, [place] 10/15/21 1424    Concha Se, MD 10/16/21 1925

## 2021-10-15 NOTE — ED Triage Notes (Signed)
Injured back yesterday while bending down to pick something up from the ground.  C.O low back pain  felt numbness and tingling down right leg last night.

## 2021-10-15 NOTE — Discharge Instructions (Addendum)
-  You may treat the pain with Tylenol and naproxen as needed.  You may additionally utilize the cyclobenzaprine as a muscle relaxer, however be cautious as it may make you drowsy.  -Follow-up with orthopedics listed above if your pain fails to improve after 5 to 6 weeks  -Return to the emergency department anytime if you begin to experience any new or worsening symptoms

## 2023-05-19 IMAGING — CR DG LUMBAR SPINE 2-3V
1 series · 3 of 3 positions shown · non-contrast
Comparison: None Available.

CLINICAL DATA: Back pain

EXAM:
LUMBAR SPINE - 2-3 VIEW

[Series 1: dg lumbar spine 2-3 views · 0.14mm/px · 3 of 3 slices shown]
[im 1/3]
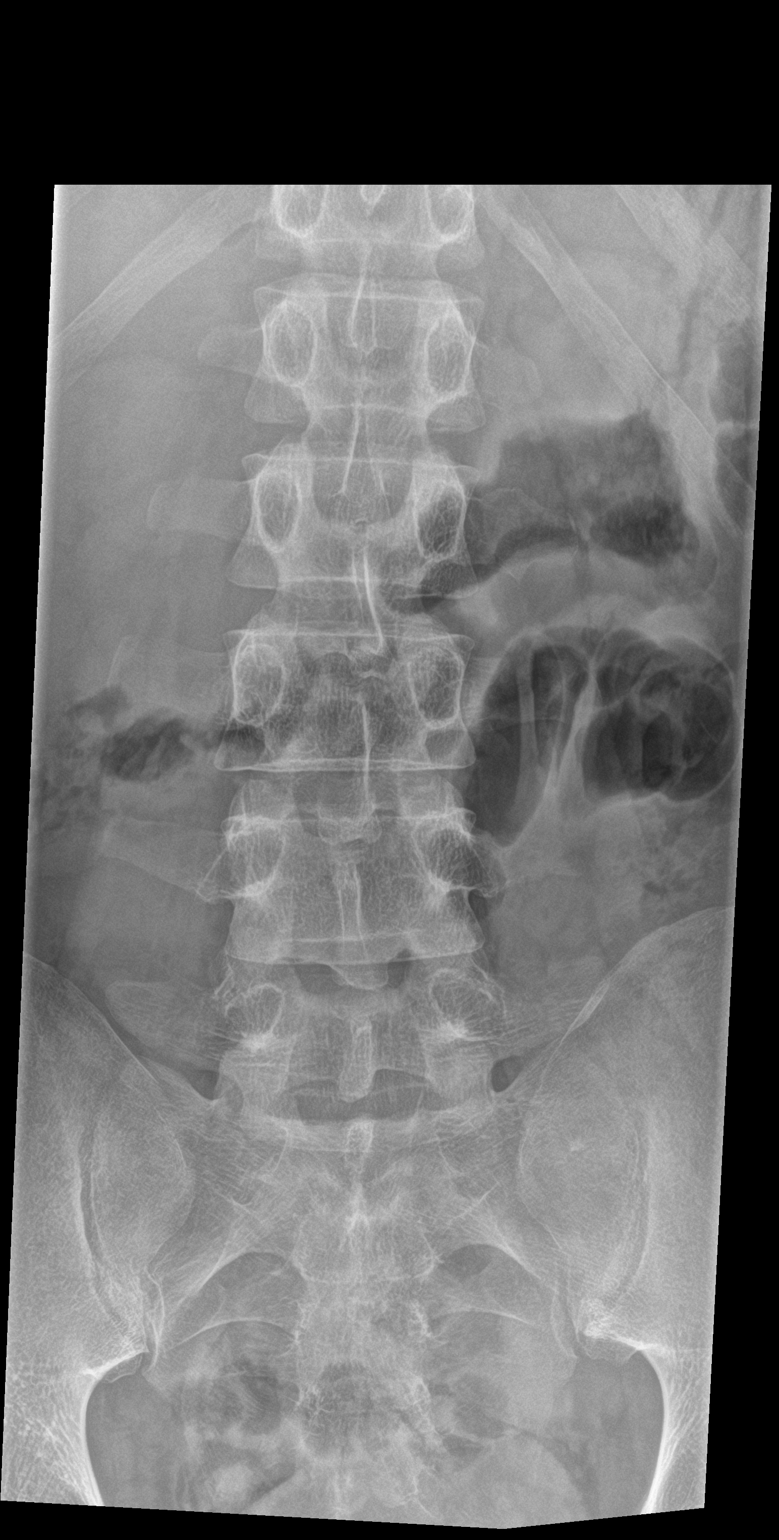
[im 2/3]
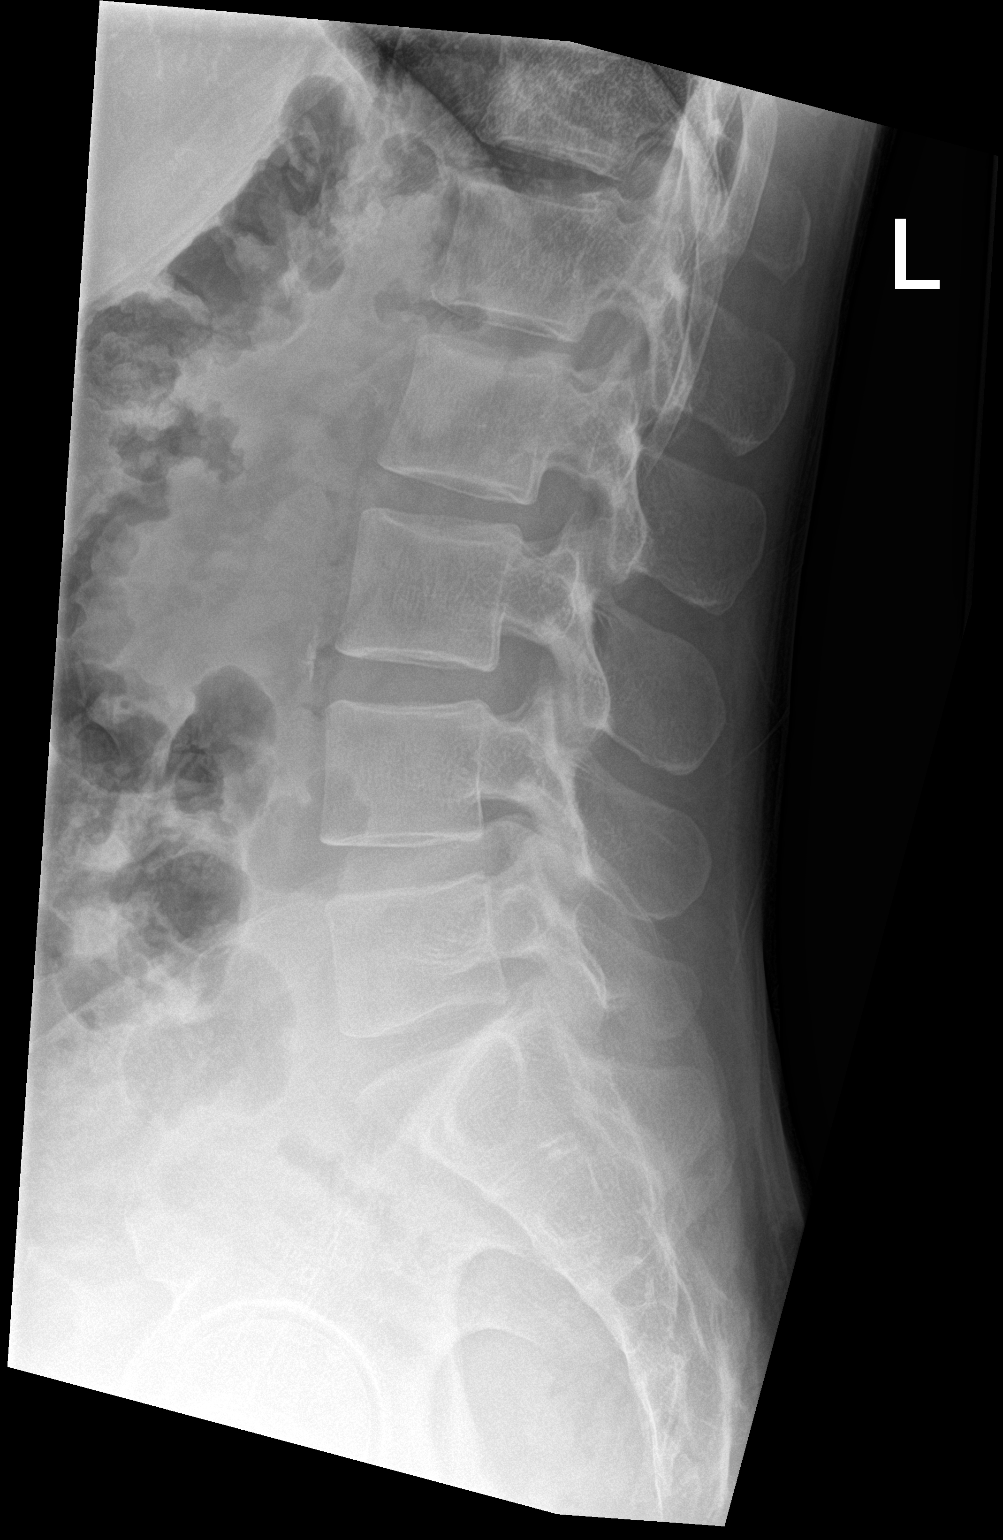
[im 3/3]
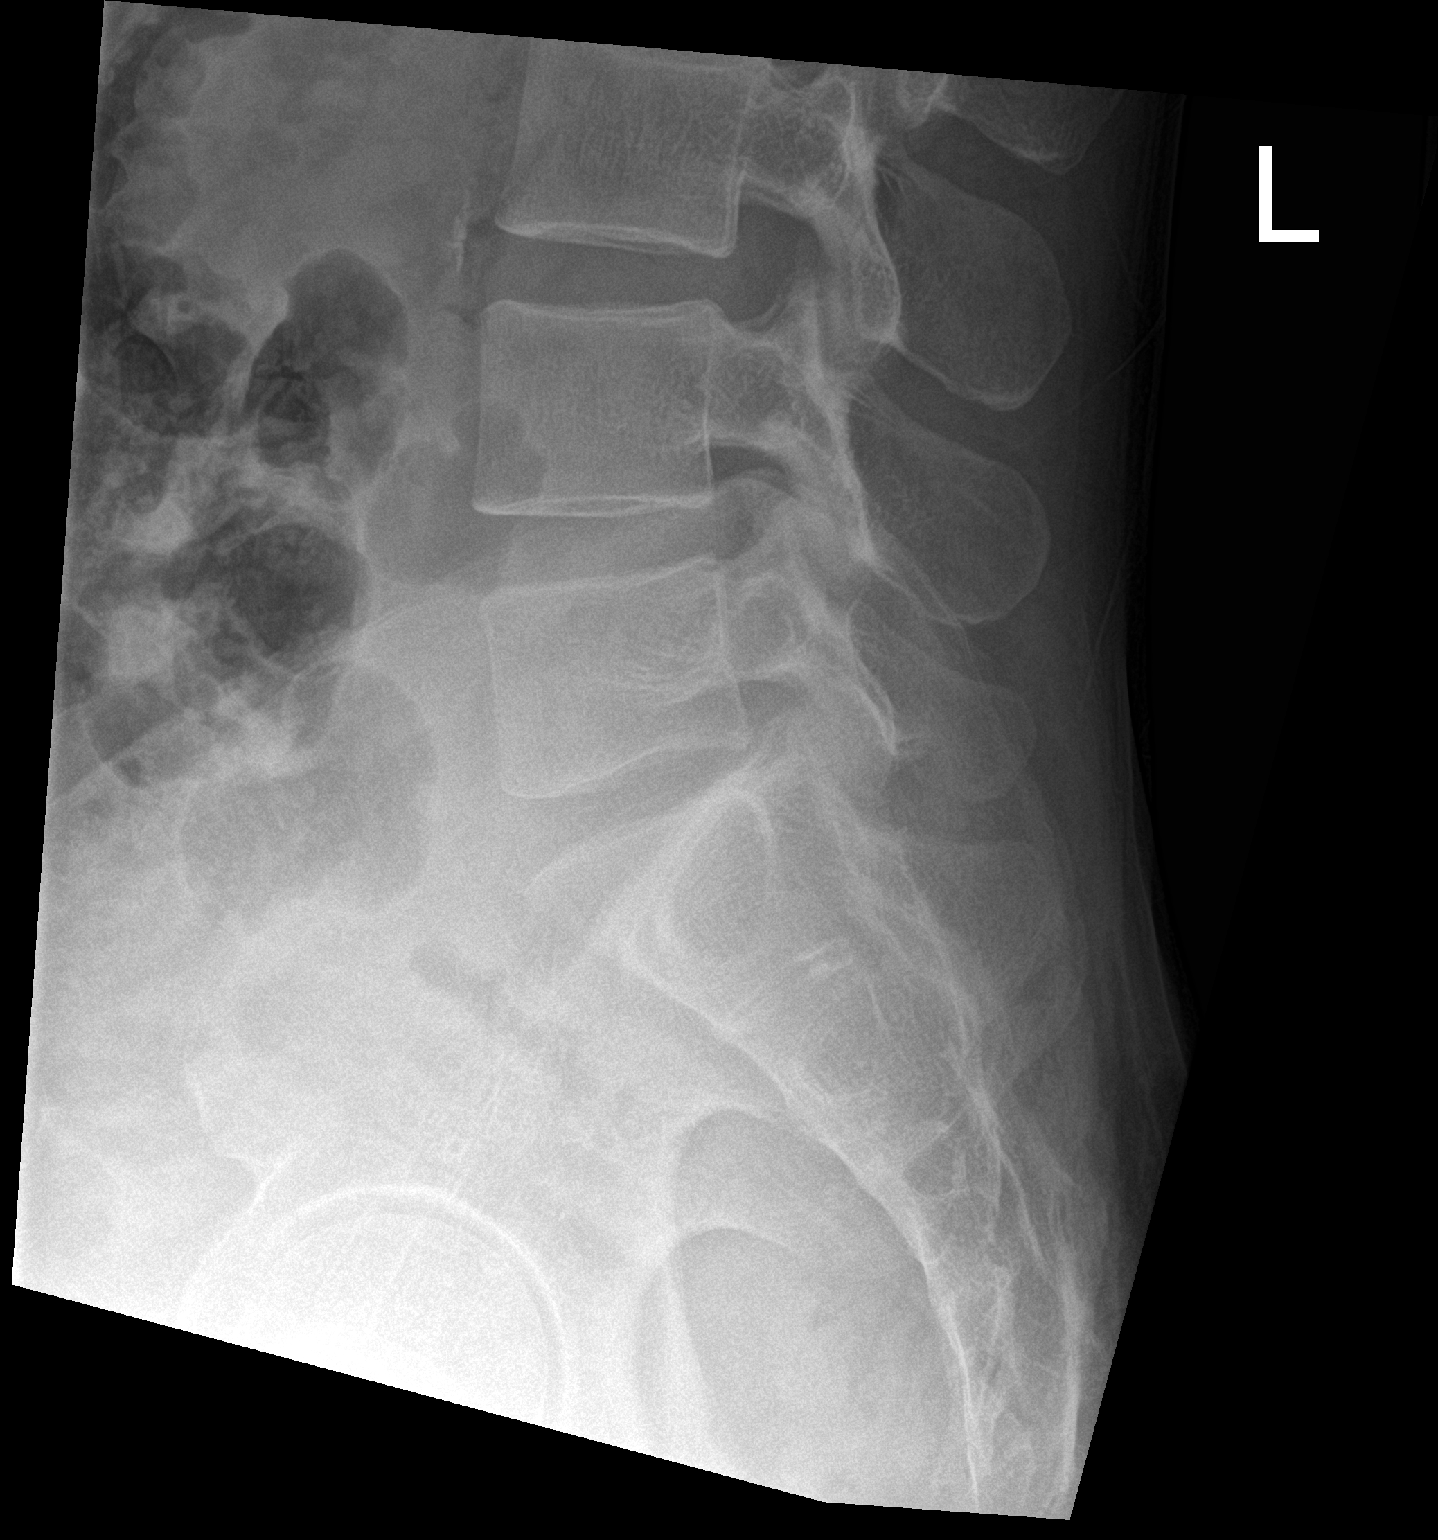

[3 of 3 positions shown; findings below may reference images not displayed]

FINDINGS: There is no evidence of lumbar spine fracture. Alignment is normal.
Intervertebral disc spaces are maintained. Aortic Atherosclerosis
([id number]-170.0).
IMPRESSION: Negative.
